# Patient Record
Sex: Male | Born: 2000 | Race: Black or African American | Hispanic: No | Marital: Single | Smoking: Never smoker
Health system: Southern US, Community
[De-identification: ages and names within clinical notes are randomized; demographics above are authoritative.]

## PROBLEM LIST (undated history)

## (undated) DIAGNOSIS — R569 Unspecified convulsions: Secondary | ICD-10-CM

## (undated) DIAGNOSIS — J302 Other seasonal allergic rhinitis: Secondary | ICD-10-CM

## (undated) DIAGNOSIS — F909 Attention-deficit hyperactivity disorder, unspecified type: Secondary | ICD-10-CM

---
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
## 2020-01-22 ENCOUNTER — Other Ambulatory Visit: Payer: Medicaid Other

## 2020-01-22 ENCOUNTER — Other Ambulatory Visit: Payer: Self-pay

## 2020-01-22 ENCOUNTER — Other Ambulatory Visit: Payer: Self-pay | Admitting: Sleep Medicine

## 2020-01-22 DIAGNOSIS — I471 Supraventricular tachycardia: Secondary | ICD-10-CM

## 2020-01-26 LAB — NOVEL CORONAVIRUS, NAA: SARS-CoV-2, NAA: NOT DETECTED

## 2021-04-25 ENCOUNTER — Emergency Department (HOSPITAL_COMMUNITY)
Admission: EM | Admit: 2021-04-25 | Discharge: 2021-04-26 | Disposition: A | Discharge status: Home / Self Care | Payer: Medicaid Other | Attending: Emergency Medicine | Admitting: Emergency Medicine

## 2021-04-25 ENCOUNTER — Other Ambulatory Visit: Payer: Self-pay

## 2021-04-25 ENCOUNTER — Encounter (HOSPITAL_COMMUNITY): Payer: Self-pay

## 2021-04-25 DIAGNOSIS — S0990XA Unspecified injury of head, initial encounter: Secondary | ICD-10-CM | POA: Diagnosis not present

## 2021-04-25 DIAGNOSIS — Z5321 Procedure and treatment not carried out due to patient leaving prior to being seen by health care provider: Secondary | ICD-10-CM | POA: Diagnosis not present
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
